# Patient Record
Sex: Male | Born: 1980 | Race: Black or African American | Hispanic: No | Marital: Single | Smoking: Never smoker
Health system: Southern US, Community
[De-identification: ages and names within clinical notes are randomized; demographics above are authoritative.]

## PROBLEM LIST (undated history)

## (undated) DIAGNOSIS — R202 Paresthesia of skin: Secondary | ICD-10-CM

## (undated) DIAGNOSIS — R200 Anesthesia of skin: Secondary | ICD-10-CM

## (undated) DIAGNOSIS — E785 Hyperlipidemia, unspecified: Secondary | ICD-10-CM

## (undated) DIAGNOSIS — M5432 Sciatica, left side: Secondary | ICD-10-CM

## (undated) DIAGNOSIS — M5126 Other intervertebral disc displacement, lumbar region: Secondary | ICD-10-CM

## (undated) DIAGNOSIS — M25561 Pain in right knee: Secondary | ICD-10-CM

## (undated) HISTORY — DX: Anesthesia of skin: R20.2

## (undated) HISTORY — DX: Sciatica, left side: M54.32

## (undated) HISTORY — DX: Other intervertebral disc displacement, lumbar region: M51.26

## (undated) HISTORY — DX: Hyperlipidemia, unspecified: E78.5

## (undated) HISTORY — DX: Paresthesia of skin: R20.0

## (undated) HISTORY — DX: Pain in right knee: M25.561

## (undated) HISTORY — DX: Anesthesia of skin: R20.0

---
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
## 2003-11-25 ENCOUNTER — Emergency Department: Payer: Self-pay | Admitting: Emergency Medicine

## 2004-09-13 ENCOUNTER — Emergency Department: Payer: Self-pay | Admitting: Emergency Medicine

## 2006-03-11 IMAGING — CR DG FOREARM 2V*L*
1 series · 2 of 2 positions shown · non-contrast
Comparison: none

REASON FOR EXAM: forearm pain      rm 3
COMMENTS:

[Series 1: view not recorded · 0.17mm/px · 2 of 2 slices shown]
[im 1/2]
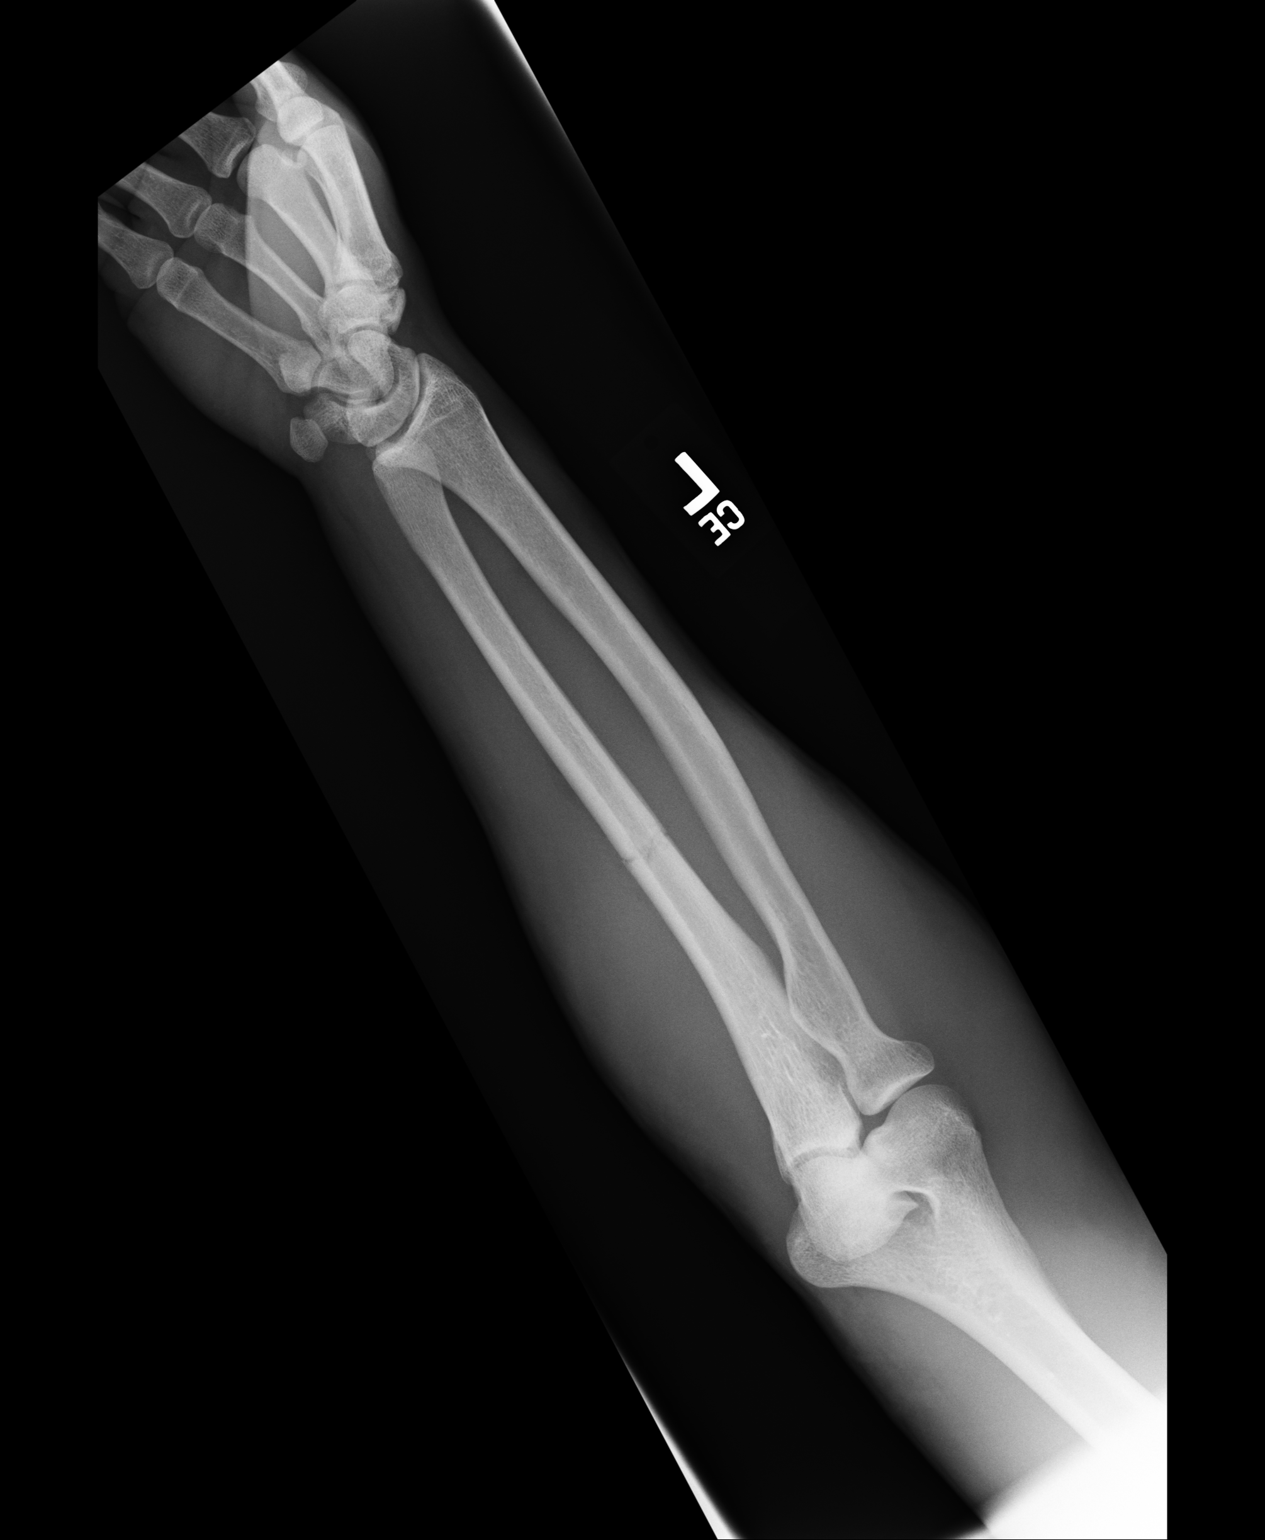
[im 2/2]
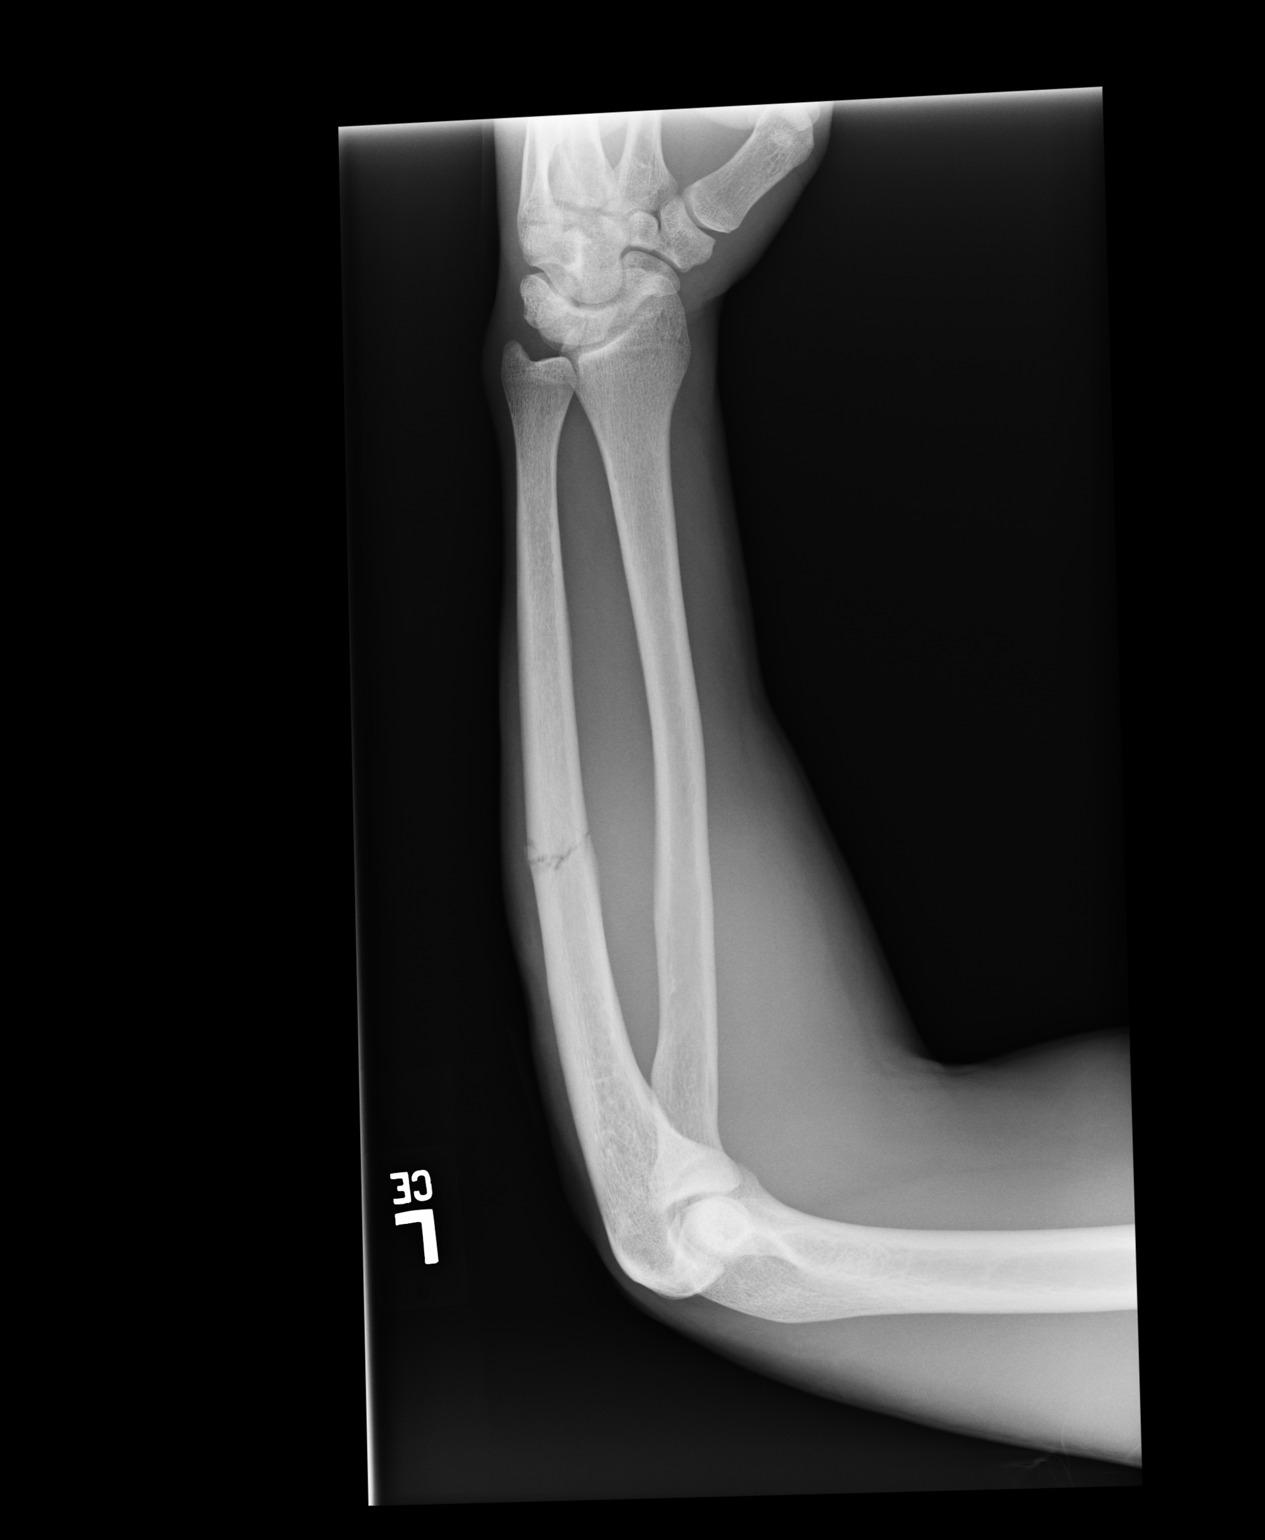

[2 of 2 positions shown; findings below may reference images not displayed]

PROCEDURE:     DXR - DXR FOREARM LEFT  - September 13, 2004  [DATE]

RESULT:     Multiple views of the LEFT forearm show a nondisplaced fracture
of the mid shaft of the ulna with soft tissue swelling.  The elbow and wrist
joints are well preserved. I do not see evidence of an accompanying radial
fracture.
IMPRESSION: 1)Nondisplaced mid shaft ulnar fracture with soft tissue swelling.

## 2017-09-09 DIAGNOSIS — M5416 Radiculopathy, lumbar region: Secondary | ICD-10-CM | POA: Insufficient documentation

## 2017-09-09 DIAGNOSIS — M545 Low back pain, unspecified: Secondary | ICD-10-CM | POA: Insufficient documentation

## 2018-11-10 ENCOUNTER — Other Ambulatory Visit: Payer: Self-pay

## 2018-11-10 DIAGNOSIS — Z20822 Contact with and (suspected) exposure to covid-19: Secondary | ICD-10-CM

## 2018-11-12 LAB — NOVEL CORONAVIRUS, NAA: SARS-CoV-2, NAA: NOT DETECTED

## 2019-01-27 HISTORY — PX: SPINAL FUSION: SHX223

## 2019-10-20 ENCOUNTER — Other Ambulatory Visit: Payer: Self-pay

## 2019-10-20 ENCOUNTER — Ambulatory Visit: Payer: Self-pay

## 2019-10-20 DIAGNOSIS — Z0000 Encounter for general adult medical examination without abnormal findings: Secondary | ICD-10-CM

## 2019-10-20 LAB — POCT URINALYSIS DIPSTICK
Bilirubin, UA: NEGATIVE
Blood, UA: NEGATIVE
Glucose, UA: NEGATIVE
Leukocytes, UA: NEGATIVE
Nitrite, UA: NEGATIVE
Protein, UA: NEGATIVE
Spec Grav, UA: >=1.03 — AB (ref 1.010–1.025)
Urobilinogen, UA: 0.2 E.U./dL
pH, UA: 6 (ref 5.0–8.0)

## 2019-10-20 NOTE — Progress Notes (Signed)
Pt presented today as [employer] hire for [employer]. Pt completed UDS and partial physical and is expected to make next apt to complete physical after UDS is resulted. CL,RMA

## 2019-10-20 NOTE — Addendum Note (Signed)
Addended by: Christianne Dolin F on: 10/20/2019 02:05 PM   Modules accepted: Orders

## 2019-10-21 LAB — CMP12+LP+TP+TSH+6AC+CBC/D/PLT
ALT: 41 IU/L (ref 0–44)
AST: 46 IU/L — ABNORMAL HIGH (ref 0–40)
Albumin/Globulin Ratio: 1.6 (ref 1.2–2.2)
Albumin: 4.6 g/dL (ref 4.0–5.0)
Alkaline Phosphatase: 71 IU/L (ref 44–121)
BUN/Creatinine Ratio: 9 (ref 9–20)
BUN: 11 mg/dL (ref 6–20)
Basophils Absolute: 0 10*3/uL (ref 0.0–0.2)
Basos: 1 %
Bilirubin Total: 0.6 mg/dL (ref 0.0–1.2)
Calcium: 9.6 mg/dL (ref 8.7–10.2)
Chloride: 103 mmol/L (ref 96–106)
Chol/HDL Ratio: 5.2 ratio — ABNORMAL HIGH (ref 0.0–5.0)
Cholesterol, Total: 217 mg/dL — ABNORMAL HIGH (ref 100–199)
Creatinine, Ser: 1.29 mg/dL — ABNORMAL HIGH (ref 0.76–1.27)
EOS (ABSOLUTE): 0.1 10*3/uL (ref 0.0–0.4)
Eos: 1 %
Estimated CHD Risk: 1.1 times avg. — ABNORMAL HIGH (ref 0.0–1.0)
Free Thyroxine Index: 1.5 (ref 1.2–4.9)
GFR calc Af Amer: 81 mL/min/{1.73_m2} (ref >59)
GFR calc non Af Amer: 70 mL/min/{1.73_m2} (ref >59)
GGT: 26 IU/L (ref 0–65)
Globulin, Total: 2.8 g/dL (ref 1.5–4.5)
Glucose: 89 mg/dL (ref 65–99)
HDL: 42 mg/dL (ref >39)
Hematocrit: 41.2 % (ref 37.5–51.0)
Hemoglobin: 13.5 g/dL (ref 13.0–17.7)
Immature Grans (Abs): 0 10*3/uL (ref 0.0–0.1)
Immature Granulocytes: 0 %
Iron: 79 ug/dL (ref 38–169)
LDH: 179 IU/L (ref 121–224)
LDL Chol Calc (NIH): 156 mg/dL — ABNORMAL HIGH (ref 0–99)
Lymphocytes Absolute: 1.5 10*3/uL (ref 0.7–3.1)
Lymphs: 42 %
MCH: 27.1 pg (ref 26.6–33.0)
MCHC: 32.8 g/dL (ref 31.5–35.7)
MCV: 83 fL (ref 79–97)
Monocytes Absolute: 0.4 10*3/uL (ref 0.1–0.9)
Monocytes: 11 %
Neutrophils Absolute: 1.7 10*3/uL (ref 1.4–7.0)
Neutrophils: 45 %
Phosphorus: 3.9 mg/dL (ref 2.8–4.1)
Platelets: 283 10*3/uL (ref 150–450)
Potassium: 4.4 mmol/L (ref 3.5–5.2)
RBC: 4.99 x10E6/uL (ref 4.14–5.80)
RDW: 12 % (ref 11.6–15.4)
Sodium: 141 mmol/L (ref 134–144)
T3 Uptake Ratio: 24 % (ref 24–39)
T4, Total: 6.2 ug/dL (ref 4.5–12.0)
TSH: 1.05 u[IU]/mL (ref 0.450–4.500)
Total Protein: 7.4 g/dL (ref 6.0–8.5)
Triglycerides: 103 mg/dL (ref 0–149)
Uric Acid: 6.2 mg/dL (ref 3.8–8.4)
VLDL Cholesterol Cal: 19 mg/dL (ref 5–40)
WBC: 3.7 10*3/uL (ref 3.4–10.8)

## 2019-10-21 LAB — HEPATITIS B SURFACE ANTIBODY,QUALITATIVE: Hep B Surface Ab, Qual: REACTIVE

## 2019-11-04 ENCOUNTER — Ambulatory Visit: Payer: Self-pay | Admitting: Nurse Practitioner

## 2019-11-04 ENCOUNTER — Encounter: Payer: Self-pay | Admitting: Nurse Practitioner

## 2019-11-04 ENCOUNTER — Other Ambulatory Visit: Payer: Self-pay

## 2019-11-04 VITALS — BP 121/60 | HR 76 | Temp 98.5°F | Resp 16 | Ht 66.0 in | Wt 170.0 lb

## 2019-11-04 DIAGNOSIS — S62009A Unspecified fracture of navicular [scaphoid] bone of unspecified wrist, initial encounter for closed fracture: Secondary | ICD-10-CM | POA: Insufficient documentation

## 2019-11-04 DIAGNOSIS — S92009A Unspecified fracture of unspecified calcaneus, initial encounter for closed fracture: Secondary | ICD-10-CM

## 2019-11-04 DIAGNOSIS — Z021 Encounter for pre-employment examination: Secondary | ICD-10-CM

## 2019-11-04 HISTORY — DX: Unspecified fracture of unspecified calcaneus, initial encounter for closed fracture: S92.009A

## 2019-11-04 HISTORY — DX: Unspecified fracture of navicular (scaphoid) bone of unspecified wrist, initial encounter for closed fracture: S62.009A

## 2019-11-04 NOTE — Progress Notes (Signed)
Subjective:     Patient ID: Nicholas Boone, male   DOB: 05-26-1980, 39 y.o.   MRN: [id number]  HPI Nicholas Boone is a 39 y.o. male who presents to the [facility] [facility] for a pre-employment physical. 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 He has a hx of a ruptured disc that required surgery L4, L5 but was released to RTW without limitation 4/21.   PMH: Lumbar radiculopathy, herniated disc Surgery: lumbar L4, L5, Fx calcaneus, scaphoid bone of left wrist Meds: none Allergies: NKDA SH: occasional ETOH use  Review of Systems  Musculoskeletal:       Back surgery, cleared to RTW  All other systems reviewed and are negative.      Objective: BP 121/60   Pulse 76   Temp 98.5 F (36.9 C)   Resp 16   Ht 5\' 6"  (1.676 m)   Wt 170 lb (77.1 kg)   SpO2 98%   BMI 27.44 kg/m     Physical Exam Vitals and nursing note reviewed.  Constitutional:      General: He is not in acute distress.    Appearance: Normal appearance. He is normal weight.  HENT:     Head: Normocephalic and atraumatic.     Jaw: There is normal jaw occlusion. No tenderness or pain on movement.     Right Ear: Tympanic membrane, ear canal and external ear normal.     Left Ear: Tympanic membrane, ear canal and external ear normal.     Nose: Nose normal.     Mouth/Throat:     Mouth: Mucous membranes are moist.     Pharynx: Oropharynx is clear.  Eyes:     General: Lids are normal.     Extraocular Movements: Extraocular movements intact.     Conjunctiva/sclera: Conjunctivae normal.     Pupils: Pupils are equal, round, and reactive to light.  Neck:     Thyroid: No thyroid mass or thyroid tenderness.     Vascular: Normal carotid pulses. No carotid bruit or JVD.     Trachea: Trachea normal. No tracheal tenderness.  Cardiovascular:     Rate and Rhythm: Normal rate and regular rhythm.     Heart sounds: No murmur heard.    Pulmonary:     Effort: Pulmonary effort is normal.     Breath sounds: Normal breath sounds and air entry.  Chest:     Chest wall: No mass.  Abdominal:     General: Bowel sounds are normal.     Palpations: Abdomen is soft.     Tenderness: There is no abdominal tenderness. There is no right CVA tenderness or left CVA tenderness.  Musculoskeletal:        General: No swelling or tenderness. Normal range of motion.     Cervical back: Normal range of motion. No rigidity. No pain with movement or spinous process tenderness. Normal range of motion.     Thoracic back: No tenderness. Normal range of motion.     Lumbar back: No deformity or tenderness. Normal range of motion.     Right lower leg: No edema.     Left lower leg: No edema.     Comments: Scar lumbar area s/p surgery  Lymphadenopathy:     Cervical: No cervical adenopathy.  Skin:    General: Skin is warm and dry.  Neurological:     Mental Status:  He is alert and oriented to person, place, and time.     Cranial Nerves: No facial asymmetry.     Sensory: Sensation is intact.     Motor: No weakness, tremor or pronator drift.     Coordination: Romberg sign negative. Coordination normal. Finger-Nose-Finger Test and Heel to [facility] Test normal.     Gait: Gait is intact.     Deep Tendon Reflexes:     Reflex Scores:      Bicep reflexes are 2+ on the right side and 2+ on the left side.      Brachioradialis reflexes are 2+ on the right side and 2+ on the left side.      Patellar reflexes are 2+ on the right side and 2+ on the left side.    Comments: Ambulatory with steady gait. Stands on one foot without difficulty. Grips are equal. Radial pulses 2+, DP pulse 2+.  Psychiatric:        Attention and Perception: Attention normal.        Mood and Affect: Mood normal.        Speech: Speech normal.        Behavior: Behavior normal.       Assessment:  39 y.o. male here for his pre-employment physical exam. Employee is s/p lumbar disc surgery  without pain. Exam today is normal. Employee has been cleared by his surgeon to RTW without limitations.Vision and hearing tests are normal. Total cholesterol slightly elevated 217, LDL 156.     Plan:    I discussed with the employee diet, exercise and immunizations. Employee given opportunity to ask questions. All questions answered and employee voices understanding. Forms completed for employment for [employer].

## 2019-11-04 NOTE — Patient Instructions (Signed)
Exercising to Stay Healthy To become healthy and stay healthy, it is recommended that you do moderate-intensity and vigorous-intensity exercise. You can tell that you are exercising at a moderate intensity if your heart starts beating faster and you start breathing faster but can still hold a conversation. You can tell that you are exercising at a vigorous intensity if you are breathing much harder and faster and cannot hold a conversation while exercising. Exercising regularly is important. It has many health benefits, such as:  Improving overall fitness, flexibility, and endurance.  Increasing bone density.  Helping with weight control.  Decreasing body fat.  Increasing muscle strength.  Reducing stress and tension.  Improving overall health. How often should I exercise? Choose an activity that you enjoy, and set realistic goals. Your health care provider can help you make an activity plan that works for you. Exercise regularly as told by your health care provider. This may include:  Doing strength training two times a week, such as: ? Lifting weights. ? Using resistance bands. ? Push-ups. ? Sit-ups. ? Yoga.  Doing a certain intensity of exercise for a given amount of time. Choose from these options: ? A total of 150 minutes of moderate-intensity exercise every week. ? A total of 75 minutes of vigorous-intensity exercise every week. ? A mix of moderate-intensity and vigorous-intensity exercise every week. Children, pregnant women, people who have not exercised regularly, people who are overweight, and older adults may need to talk with a health care provider about what activities are safe to do. If you have a medical condition, be sure to talk with your health care provider before you start a new exercise program. What are some exercise ideas? Moderate-intensity exercise ideas include:  Walking 1 mile (1.6 km) in about 15  minutes.  Biking.  Hiking.  Golfing.  Dancing.  Water aerobics. Vigorous-intensity exercise ideas include:  Walking 4.5 miles (7.2 km) or more in about 1 hour.  Jogging or running 5 miles (8 km) in about 1 hour.  Biking 10 miles (16.1 km) or more in about 1 hour.  Lap swimming.  Roller-skating or in-line skating.  Cross-country skiing.  Vigorous competitive sports, such as football, basketball, and soccer.  Jumping rope.  Aerobic dancing. What are some everyday activities that can help me to get exercise?  Yard work, such as: ? Pushing a lawn mower. ? Raking and bagging leaves.  Washing your car.  Pushing a stroller.  Shoveling snow.  Gardening.  Washing windows or floors. How can I be more active in my day-to-day activities?  Use stairs instead of an elevator.  Take a walk during your lunch break.  If you drive, park your car farther away from your work or school.  If you take public transportation, get off one stop early and walk the rest of the way.  Stand up or walk around during all of your indoor phone calls.  Get up, stretch, and walk around every 30 minutes throughout the day.  Enjoy exercise with a friend. Support to continue exercising will help you keep a regular routine of activity. What guidelines can I follow while exercising?  Before you start a new exercise program, talk with your health care provider.  Do not exercise so much that you hurt yourself, feel dizzy, or get very short of breath.  Wear comfortable clothes and wear shoes with good support.  Drink plenty of water while you exercise to prevent dehydration or heat stroke.  Work out until your breathing   and your heartbeat get faster. Where to find more information  [place] Department of Health and Human Services: www.hhs.gov  Centers for Disease Control and Prevention (CDC): www.cdc.gov Summary  Exercising regularly is important. It will improve your overall fitness,  flexibility, and endurance.  Regular exercise also will improve your overall health. It can help you control your weight, reduce stress, and improve your bone density.  Do not exercise so much that you hurt yourself, feel dizzy, or get very short of breath.  Before you start a new exercise program, talk with your health care provider. This information is not intended to replace advice given to you by your health care provider. Make sure you discuss any questions you have with your health care provider. Document Revised: 12/21/2016 Document Reviewed: 11/29/2016 Elsevier Patient Education  2020 [employer]. Healthy Eating Following a healthy eating pattern may help you to achieve and maintain a healthy body weight, reduce the risk of chronic disease, and live a long and productive life. It is important to follow a healthy eating pattern at an appropriate calorie level for your body. Your nutritional needs should be met primarily through food by choosing a variety of nutrient-rich foods. What are tips for following this plan? Reading food labels  Read labels and choose the following: ? Reduced or low sodium. ? Juices with 100% fruit juice. ? Foods with low saturated fats and high polyunsaturated and monounsaturated fats. ? Foods with whole grains, such as whole wheat, cracked wheat, brown rice, and wild rice. ? Whole grains that are fortified with folic acid. This is recommended for women who are pregnant or who want to become pregnant.  Read labels and avoid the following: ? Foods with a lot of added sugars. These include foods that contain brown sugar, corn sweetener, corn syrup, dextrose, fructose, glucose, high-fructose corn syrup, honey, invert sugar, lactose, malt syrup, maltose, molasses, raw sugar, sucrose, trehalose, or turbinado sugar.  Do not eat more than the following amounts of added sugar per day:  6 teaspoons (25 g) for women.  9 teaspoons (38 g) for men. ? Foods that  contain processed or refined starches and grains. ? Refined grain products, such as white flour, degermed cornmeal, white bread, and white rice. Shopping  Choose nutrient-rich snacks, such as vegetables, whole fruits, and nuts. Avoid high-calorie and high-sugar snacks, such as potato chips, fruit snacks, and candy.  Use oil-based dressings and spreads on foods instead of solid fats such as butter, stick margarine, or cream cheese.  Limit pre-made sauces, mixes, and "instant" products such as flavored rice, instant noodles, and ready-made pasta.  Try more plant-protein sources, such as tofu, tempeh, black beans, edamame, lentils, nuts, and seeds.  Explore eating plans such as the Mediterranean diet or vegetarian diet. Cooking  Use oil to saut or stir-fry foods instead of solid fats such as butter, stick margarine, or lard.  Try baking, boiling, grilling, or broiling instead of frying.  Remove the fatty part of meats before cooking.  Steam vegetables in water or broth. Meal planning   At meals, imagine dividing your plate into fourths: ? One-half of your plate is fruits and vegetables. ? One-fourth of your plate is whole grains. ? One-fourth of your plate is protein, especially lean meats, poultry, eggs, tofu, beans, or nuts.  Include low-fat dairy as part of your daily diet. Lifestyle  Choose healthy options in all settings, including home, work, school, restaurants, or stores.  Prepare your food safely: ? Wash your hands   after handling raw meats. ? Keep food preparation surfaces clean by regularly washing with hot, soapy water. ? Keep raw meats separate from ready-to-eat foods, such as fruits and vegetables. ? Cook seafood, meat, poultry, and eggs to the recommended internal temperature. ? Store foods at safe temperatures. In general:  Keep cold foods at 40F (4.4C) or below.  Keep hot foods at 140F (60C) or above.  Keep your freezer at 0F (-17.8C) or  below.  Foods are no longer safe to eat when they have been between the temperatures of 40-140F (4.4-60C) for more than 2 hours. What foods should I eat? Fruits Aim to eat 2 cup-equivalents of fresh, canned (in natural juice), or frozen fruits each day. Examples of 1 cup-equivalent of fruit include 1 small apple, 8 large strawberries, 1 cup canned fruit,  cup dried fruit, or 1 cup 100% juice. Vegetables Aim to eat 2-3 cup-equivalents of fresh and frozen vegetables each day, including different varieties and colors. Examples of 1 cup-equivalent of vegetables include 2 medium carrots, 2 cups raw, leafy greens, 1 cup chopped vegetable (raw or cooked), or 1 medium baked potato. Grains Aim to eat 6 ounce-equivalents of whole grains each day. Examples of 1 ounce-equivalent of grains include 1 slice of bread, 1 cup ready-to-eat cereal, 3 cups popcorn, or  cup cooked rice, pasta, or cereal. Meats and other proteins Aim to eat 5-6 ounce-equivalents of protein each day. Examples of 1 ounce-equivalent of protein include 1 egg, 1/2 cup nuts or seeds, or 1 tablespoon (16 g) peanut butter. A cut of meat or fish that is the size of a deck of cards is about 3-4 ounce-equivalents.  Of the protein you eat each week, try to have at least 8 ounces come from seafood. This includes salmon, trout, herring, and anchovies. Dairy Aim to eat 3 cup-equivalents of fat-free or low-fat dairy each day. Examples of 1 cup-equivalent of dairy include 1 cup (240 mL) milk, 8 ounces (250 g) yogurt, 1 ounces (44 g) natural cheese, or 1 cup (240 mL) fortified soy milk. Fats and oils  Aim for about 5 teaspoons (21 g) per day. Choose monounsaturated fats, such as canola and olive oils, avocados, peanut butter, and most nuts, or polyunsaturated fats, such as sunflower, corn, and soybean oils, walnuts, pine nuts, sesame seeds, sunflower seeds, and flaxseed. Beverages  Aim for six 8-oz glasses of water per day. Limit coffee to three  to five 8-oz cups per day.  Limit caffeinated beverages that have added calories, such as soda and energy drinks.  Limit alcohol intake to no more than 1 drink a day for nonpregnant women and 2 drinks a day for men. One drink equals 12 oz of beer (355 mL), 5 oz of wine (148 mL), or 1 oz of hard liquor (44 mL). Seasoning and other foods  Avoid adding excess amounts of salt to your foods. Try flavoring foods with herbs and spices instead of salt.  Avoid adding sugar to foods.  Try using oil-based dressings, sauces, and spreads instead of solid fats. This information is based on general [place] nutrition guidelines. For more information, visit choosemyplate.gov. Exact amounts may vary based on your nutrition needs. Summary  A healthy eating plan may help you to maintain a healthy weight, reduce the risk of chronic diseases, and stay active throughout your life.  Plan your meals. Make sure you eat the right portions of a variety of nutrient-rich foods.  Try baking, boiling, grilling, or broiling instead of frying.    Choose healthy options in all settings, including home, work, school, restaurants, or stores. This information is not intended to replace advice given to you by your health care provider. Make sure you discuss any questions you have with your health care provider. Document Revised: 04/22/2017 Document Reviewed: 04/22/2017 Elsevier Patient Education  [place].  Immunization Schedule, 66-30 Years Old  Vaccines are usually given at various ages, according to a schedule. Your health care provider will recommend vaccines for you based on your age, medical history, and lifestyle or other factors, such as travel or where you work. You may receive vaccines as individual doses or as more than one vaccine together in one shot (combination vaccines). Talk with your health care provider about the risks and benefits of combination vaccines. Recommended immunizations for 31-49 years  old Influenza vaccine  You should get a dose of the influenza vaccine every year. Tetanus, diphtheria, and pertussis vaccine A vaccine that protects against tetanus, diphtheria, and pertussis is known as the Tdap vaccine. A vaccine that protects against tetanus and diphtheria is known as the Td vaccine.  You should only get the Td vaccine if you have had at least 1 dose of the Tdap vaccine.  You should get 1 dose of the Td or Tdap vaccine every 10 years, or you should get 1 dose of the Tdap vaccine if: ? You have not previously gotten a Tdap vaccine. ? You do not know if you have ever gotten a Tdap vaccine. ? You are between 27 and [redacted] weeks pregnant. Measles, mumps, and rubella vaccine This is also known as the MMR vaccine. You may need to get the MMR vaccine if:  You need to catch up on doses you missed in the past.  You have not been given the vaccine before.  You do not have evidence of immunity (by a blood test). Pregnant women should not get the MMR vaccine during pregnancy because it may be harmful to the unborn baby. However, if you are not immune to measles, mumps, or rubella, you should get a dose of MMR vaccine one month or more before pregnancy or within days after delivery. Varicella vaccine This is also known as the VAR vaccine. You may need to get the VAR vaccine if you were born in 1980 or later and:  You need to catch up on doses you missed in the past.  You have not been given the vaccine before.  You do not have evidence of immunity (by a blood test).  You have certain high-risk conditions, such as HIV or AIDS. Pregnant women should not get the VAR vaccine during pregnancy because it may be harmful to the unborn baby. However, if you are not immune to chickenpox (varicella), you should get a dose of the VAR vaccine within days after delivery. Human papillomavirus vaccine This is also known as the HPV vaccine. If you have not gotten the vaccine before or you missed  doses in the past, talk to your health care provider about whether it is appropriate for you to get the HPV vaccine. Pneumococcal conjugate vaccine This is also known as the PCV13 vaccine. You should get the PCV13 vaccine as recommended if you have certain high-risk conditions. These include:  Diabetes.  Chronic conditions of the heart, lungs, or liver.  Conditions that affect the body's disease-fighting system (immune system). Pneumococcal polysaccharide vaccine This is also known as the PPSV23 vaccine. You should get the PPSV23 vaccine as recommended if you have certain high-risk  conditions. These include:  Diabetes.  Chronic conditions of the heart, lungs, or liver.  Conditions that affect the immune system. Hepatitis A vaccine This is also known as the HepA vaccine. If you did not get the HepA vaccine previously, you should get it if:  You are at risk for a hepatitis A infection. You may be at risk for infection if you: ? Have chronic liver disease. ? Have HIV or AIDS. ? Are a man who has sex with men. ? Use drugs. ? Are homeless. ? May be exposed to hepatitis A through work. ? Travel to countries where hepatitis A is common. ? Are pregnant. ? Have or will have close contact with someone who was adopted from another country.  You are not at risk for infection but want protection from hepatitis A. Hepatitis B vaccine This is also known as the HepB vaccine. If you did not get the HepB vaccine previously, you should get it if:  You are at risk for hepatitis B infection. You are at risk if you: ? Have chronic liver disease. ? Have HIV or AIDS. ? Have sex with a partner who has hepatitis B, or:  You have multiple sex partners.  You are a man who has sex with men. ? Use drugs. ? May be exposed to hepatitis B through work. ? Live with someone who has hepatitis B. ? Receive dialysis treatment. ? Have diabetes. ? Travel to countries where hepatitis B is common. ? Are  pregnant.  You are not at risk of infection but want protection from hepatitis B. Meningococcal conjugate vaccine This is also known as the MenACWY vaccine. You may need to get the MenACWY vaccine if you:  Have not been given the vaccine before.  Need to catch up on doses you missed in the past. This vaccine is especially important if you:  Do not have a spleen.  Have sickle cell disease.  Have HIV.  Take medicines that suppress your immune system.  Travel to countries where meningococcal disease is common.  Are exposed to Neisseria meningitidis at work. Serogroup B meningococcal vaccine This is also known as the MenB vaccine. You may need to get the MenB vaccine if you:  Have not been given the vaccine before.  Need to catch up on doses you missed in the past. This vaccine is especially important if you:  Do not have a spleen.  Have sickle cell disease.  Take medicines that suppress your immune system.  Are exposed to Neisseria meningitidis at work. Haemophilus influenzae type b vaccine This is also known as the Hib vaccine. Anyone older than 38 years of age is usually not given the Hib vaccine. However, if you have certain high-risk conditions, you may need to get this vaccine. These conditions include:  Not having a spleen.  Having received a stem cell transplant. Before you get a vaccine: Talk with your health care provider about which vaccines are right for you. This is especially important if:  You previously had a reaction after getting a vaccine.  You have a weakened immune system. You may have a weakened immune system if you: ? Are taking medicines that reduce (suppress) the activity of your immune system. ? Are taking medicines to treat cancer (chemotherapy). ? Have HIV or AIDS.  You work in an environment where you may be exposed to a disease.  You plan to travel outside of the country.  You have a chronic illness, such as heart disease, kidney  disease, diabetes, or lung disease.  You are pregnant, think you may be pregnant, or are planning to become pregnant. Summary  Before you get a vaccine, tell your health care provider if you have reacted to vaccines in the past or have a condition that weakens your immune system.  At 27-49 years, you should get a dose of the influenza vaccine every year and a dose of the Td or Tdap vaccine every 10 years.  Depending on your medical history and your risk factors, you may need other vaccines. Ask your health care provider whether you are up to date on all your vaccines.  Women who are pregnant may not receive certain vaccines. Ask your health care provider whether you should receive any vaccines soon after you deliver your baby. This information is not intended to replace advice given to you by your health care provider. Make sure you discuss any questions you have with your health care provider. Document Revised: 11/05/2018 Document Reviewed: 11/05/2018 Elsevier Patient Education  [place].

## 2023-08-19 ENCOUNTER — Ambulatory Visit: Payer: Self-pay

## 2024-02-12 ENCOUNTER — Ambulatory Visit

## 2024-02-12 VITALS — BP 108/70 | HR 62 | Ht 66.0 in | Wt 186.4 lb

## 2024-02-12 DIAGNOSIS — Z981 Arthrodesis status: Secondary | ICD-10-CM | POA: Diagnosis not present

## 2024-02-12 DIAGNOSIS — R55 Syncope and collapse: Secondary | ICD-10-CM | POA: Diagnosis not present

## 2024-02-12 NOTE — Progress Notes (Signed)
 "   New Patient Visit   Physician: Kuuipo Anzaldo A Razan Siler, MD  Patient: Nicholas Boone   DOB: 25-Sep-1980   44 y.o. Male  MRN: [id number] Visit Date: 02/12/2024   Chief Complaint  Patient presents with   Establish Care   Subjective  Nicholas Boone is a 44 y.o. male who presents today as a new patient to establish care.   HPI  Discussed the use of AI scribe software for clinical note transcription with the patient, who gave verbal consent to proceed.   History of Present Illness   Nicholas Boone is a 44 year old male who presents with right arm numbness and lighhededness experienced during a football game.  Patient had been coaching a football game and had his heart rate elevated he had not eaten or drink during the day he had sudden onset of right arm numbness and feeling as if he would pass out.  Symptoms improved after eating and drinking.  He does exercise on a regular basis and has not had any shortness of breath or symptoms while exercising.  He is generally in good health no family history of significant heart disease.  He denies any episodes of chest pain palpitations exertional shortness of breath  Occasional nausea in the mornings when he wakes up he has had few episodes of vomiting which are infrequent.  Not sure if this is related to heartburn.  Surgical history significant for spinal fusion surgery of the lumbar spine in 2020.  Patient has not had labs done since 2021.  Does not receive vaccines.    Diet reported is overall healthy he does exercise on a regular basis.  Works about 60 hours weekly.  Has 4 children.     ASSESSMENT & PLAN  Encounter Diagnoses  Name Primary?   Pre-syncope Yes   History of lumbar spinal fusion     Orders Placed This Encounter  Procedures   CBC with Differential/Platelet   Comprehensive metabolic panel with GFR   Hemoglobin A1c   Lipid panel   Urinalysis, Routine w reflex microscopic   EKG 12-Lead    Assessment and Plan     Patient with episode of presyncope.  - Suspect this is more dehydration related however will obtain EKG and send him for basic labs today.  Discussed with patient if we have any recurrence of symptoms that he needs to be seen immediately.  Need to rule out for any underlying cardiac etiology.  No concerns on physical exam today.  He does have a history of hyperlipidemia on labs seen in 2021  Plan for follow-up for this patient in 2 weeks.  Discussed if he has any recurrence of morning vomiting to follow-up.  He has a history of spinal fusion which has been stable he does not have any ongoing chronic back pain.   Objective  BP 108/70 (BP Location: Left Arm, Patient Position: Sitting, Cuff Size: Normal)   Pulse 62   Ht 5' 6 (1.676 m)   Wt 186 lb 6 oz (84.5 kg)   SpO2 94%   BMI 30.08 kg/m      Review of Systems  Constitutional:  Negative for chills, fever and weight loss.  Eyes:  Negative for blurred vision. h Respiratory:  Negative for cough and shortness of breath.   Cardiovascular:  Negative for chest pain and palpitations.  Skin:  Negative for rash.  Psychiatric/Behavioral:  Negative for depression. The patient is not nervous/anxious.      Physical Exam  Physical Exam Vitals reviewed.  Constitutional:      Appearance: Normal appearance. Well-developed with normal weight.  HENT:     Head: Normocephalic and atraumatic.  Normal mucous membranes, no oral lesions Eyes:     Pupils: Pupils are equal, round, and reactive to light.  Neck:     Thyroid: No thyroid mass or thyromegaly.  Cardiovascular:     Rate and Rhythm: Normal rate and regular rhythm. Normal heart sounds. Normal peripheral pulses Pulmonary:     Normal breath sounds with normal effort Abdominal:   Abdomen is soft, without tenderness or noted hepatosplenomegaly Musculoskeletal:        General: No swelling or edema  Lymphadenopathy:     Cervical: No cervical adenopathy.  Skin:    General: Skin is warm and  dry without noticeable rash. Neurological:     General: No focal deficit present.  Psychiatric:        Mood and Affect: Mood, behavior and cognition normal   Past Medical History:  Diagnosis Date   Closed fracture of scaphoid bone of wrist 11/04/2019   Displacement of lumbar intervertebral disc without myelopathy    Elevated lipids    Fracture of calcaneus 11/04/2019   Left sciatic nerve pain    Numbness and tingling of left leg and hip    Right knee pain    Past Surgical History:  Procedure Laterality Date   SPINAL FUSION  01/27/2019   Family Status  Relation Name Status   Mother  Alive  No partnership data on file   Family History  Problem Relation Age of Onset   Hypertension Mother    Social History   Socioeconomic History   Marital status: Single    Spouse name: Not on file   Number of children: Not on file   Years of education: Not on file   Highest education level: Not on file  Occupational History   Not on file  Tobacco Use   Smoking status: Never   Smokeless tobacco: Never  Substance and Sexual Activity   Alcohol use: Yes    Comment: social   Drug use: Never   Sexual activity: Not on file  Other Topics Concern   Not on file  Social History Narrative   Not on file   Social Drivers of Health   Tobacco Use: Low Risk (02/12/2024)   Patient History    Smoking Tobacco Use: Never    Smokeless Tobacco Use: Never    Passive Exposure: Not on file  Financial Resource Strain: Not on file  Food Insecurity: Not on file  Transportation Needs: Not on file  Physical Activity: Not on file  Stress: Not on file  Social Connections: Unknown (06/06/2021)   [facility]   Social Network    Social Network: Not on file  Depression (PHQ2-9): Not on file  Alcohol Screen: Not on file  Housing: Not on file  Utilities: Not on file  Health Literacy: Not on file   Show/hide medication list[1] Allergies[2]  Immunization History  Administered Date(s)  Administered   Influenza-Unspecified 11/07/2011    Health Maintenance  Topic Date Due   HIV Screening  Never done   Hepatitis C Screening  Never done   DTaP/Tdap/Td (1 - Tdap) Never done   Hepatitis B Vaccines 19-59 Average Risk (2 of 3 - 19+ 3-dose series) 06/08/2005   Influenza Vaccine  08/23/2023   COVID-19 Vaccine (1 - 2025-26 season) Never done   HPV VACCINES (No Doses Required)  Completed   Pneumococcal Vaccine  Aged Out   Meningococcal B Vaccine  Aged Out    Patient Care Team: Everlene Parris LABOR, MD as PCP - General (Family Medicine)  Depression Screen     No data to display           Parris LABOR Everlene, MD  [facility] [phone] (phone) [phone] (fax)  [facility]    [1]  No outpatient medications prior to visit.   No facility-administered medications prior to visit.  [2] No Known Allergies  "

## 2024-02-13 LAB — URINALYSIS, ROUTINE W REFLEX MICROSCOPIC
Bilirubin Urine: NEGATIVE
Glucose, UA: NEGATIVE
Hgb urine dipstick: NEGATIVE
Ketones, ur: NEGATIVE
Leukocytes,Ua: NEGATIVE
Nitrite: NEGATIVE
Protein, ur: NEGATIVE
Specific Gravity, Urine: 1.027 (ref 1.001–1.035)
pH: 5.5 (ref 5.0–8.0)

## 2024-02-13 LAB — COMPREHENSIVE METABOLIC PANEL WITH GFR
AG Ratio: 1.5 (calc) (ref 1.0–2.5)
ALT: 51 U/L — ABNORMAL HIGH (ref 9–46)
AST: 33 U/L (ref 10–40)
Albumin: 4.7 g/dL (ref 3.6–5.1)
Alkaline phosphatase (APISO): 50 U/L (ref 36–130)
BUN/Creatinine Ratio: 9 (calc) (ref 6–22)
BUN: 13 mg/dL (ref 7–25)
CO2: 31 mmol/L (ref 20–32)
Calcium: 9.9 mg/dL (ref 8.6–10.3)
Chloride: 103 mmol/L (ref 98–110)
Creat: 1.41 mg/dL — ABNORMAL HIGH (ref 0.60–1.29)
Globulin: 3.2 g/dL (ref 1.9–3.7)
Glucose, Bld: 91 mg/dL (ref 65–99)
Potassium: 4.8 mmol/L (ref 3.5–5.3)
Sodium: 141 mmol/L (ref 135–146)
Total Bilirubin: 0.8 mg/dL (ref 0.2–1.2)
Total Protein: 7.9 g/dL (ref 6.1–8.1)
eGFR: 63 mL/min/1.73m2

## 2024-02-13 LAB — CBC WITH DIFFERENTIAL/PLATELET
Absolute Lymphocytes: 2370 {cells}/uL (ref 850–3900)
Absolute Monocytes: 420 {cells}/uL (ref 200–950)
Basophils Absolute: 30 {cells}/uL (ref 0–200)
Basophils Relative: 0.5 %
Eosinophils Absolute: 48 {cells}/uL (ref 15–500)
Eosinophils Relative: 0.8 %
HCT: 41.8 % (ref 39.4–51.1)
Hemoglobin: 13.8 g/dL (ref 13.2–17.1)
MCH: 27.3 pg (ref 27.0–33.0)
MCHC: 33 g/dL (ref 31.6–35.4)
MCV: 82.8 fL (ref 81.4–101.7)
MPV: 9.3 fL (ref 7.5–12.5)
Monocytes Relative: 7 %
Neutro Abs: 3132 {cells}/uL (ref 1500–7800)
Neutrophils Relative %: 52.2 %
Platelets: 325 Thousand/uL (ref 140–400)
RBC: 5.05 Million/uL (ref 4.20–5.80)
RDW: 12.7 % (ref 11.0–15.0)
Total Lymphocyte: 39.5 %
WBC: 6 Thousand/uL (ref 3.8–10.8)

## 2024-02-13 LAB — HEMOGLOBIN A1C
Hgb A1c MFr Bld: 5.4 %
Mean Plasma Glucose: 108 mg/dL
eAG (mmol/L): 6 mmol/L

## 2024-02-13 LAB — LIPID PANEL
Cholesterol: 238 mg/dL — ABNORMAL HIGH
HDL: 43 mg/dL
LDL Cholesterol (Calc): 171 mg/dL — ABNORMAL HIGH
Non-HDL Cholesterol (Calc): 195 mg/dL — ABNORMAL HIGH
Total CHOL/HDL Ratio: 5.5 (calc) — ABNORMAL HIGH
Triglycerides: 115 mg/dL

## 2024-02-26 ENCOUNTER — Ambulatory Visit

## 2024-03-04 ENCOUNTER — Ambulatory Visit: Admitting: Family Medicine
